# Patient Record
Sex: Male | Born: 1965 | Race: White | Hispanic: No | Marital: Single | Smoking: Never smoker
Health system: Southern US, Community
[De-identification: ages and names within clinical notes are randomized; demographics above are authoritative.]

---

## 2011-09-05 ENCOUNTER — Emergency Department (HOSPITAL_COMMUNITY): Payer: BC Managed Care – PPO

## 2011-09-05 ENCOUNTER — Encounter (HOSPITAL_COMMUNITY): Payer: Self-pay | Admitting: *Deleted

## 2011-09-05 ENCOUNTER — Observation Stay (HOSPITAL_COMMUNITY)
Admission: EM | Admit: 2011-09-05 | Discharge: 2011-09-06 | Disposition: A | Discharge status: Home / Self Care | Payer: BC Managed Care – PPO | Attending: Internal Medicine | Admitting: Internal Medicine

## 2011-09-05 DIAGNOSIS — R079 Chest pain, unspecified: Principal | ICD-10-CM | POA: Insufficient documentation

## 2011-09-05 DIAGNOSIS — R002 Palpitations: Secondary | ICD-10-CM | POA: Insufficient documentation

## 2011-09-05 LAB — CBC
HCT: 42.6 % (ref 39.0–52.0)
Hemoglobin: 14.7 g/dL (ref 13.0–17.0)
MCH: 28.5 pg (ref 26.0–34.0)
RBC: 5.15 MIL/uL (ref 4.22–5.81)

## 2011-09-05 LAB — BASIC METABOLIC PANEL
BUN: 9 mg/dL (ref 6–23)
CO2: 24 mEq/L (ref 19–32)
Calcium: 9.5 mg/dL (ref 8.4–10.5)
Glucose, Bld: 88 mg/dL (ref 70–99)
Potassium: 4.1 mEq/L (ref 3.5–5.1)
Sodium: 136 mEq/L (ref 135–145)

## 2011-09-05 MED ORDER — ACETAMINOPHEN 650 MG RE SUPP: 650.0000 mg | Freq: Four times a day (QID) | RECTAL | Status: DC | PRN

## 2011-09-05 MED ORDER — NITROGLYCERIN 0.4 MG SL SUBL: 0.4000 mg | SUBLINGUAL_TABLET | SUBLINGUAL | Status: DC | PRN

## 2011-09-05 MED ORDER — PANTOPRAZOLE SODIUM 40 MG PO TBEC
40.0000 mg | DELAYED_RELEASE_TABLET | Freq: Every day | ORAL | Status: DC
  Administered 2011-09-06: 40 mg via ORAL
  Filled 2011-09-05: qty 1

## 2011-09-05 MED ORDER — ACETAMINOPHEN 325 MG PO TABS: 650.0000 mg | ORAL_TABLET | Freq: Four times a day (QID) | ORAL | Status: DC | PRN

## 2011-09-05 MED ORDER — SODIUM CHLORIDE 0.9 % IJ SOLN
3.0000 mL | Freq: Two times a day (BID) | INTRAMUSCULAR | Status: DC
  Administered 2011-09-05: 3 mL via INTRAVENOUS

## 2011-09-05 MED ORDER — ASPIRIN EC 325 MG PO TBEC
325.0000 mg | DELAYED_RELEASE_TABLET | Freq: Every day | ORAL | Status: DC
  Administered 2011-09-06: 325 mg via ORAL
  Filled 2011-09-05: qty 1

## 2011-09-05 MED ORDER — GI COCKTAIL ~~LOC~~
30.0000 mL | Freq: Once | ORAL | Status: AC
  Administered 2011-09-05: 30 mL via ORAL
  Filled 2011-09-05: qty 30

## 2011-09-05 MED ORDER — NITROGLYCERIN 0.4 MG SL SUBL
0.4000 mg | SUBLINGUAL_TABLET | SUBLINGUAL | Status: DC | PRN
  Administered 2011-09-05: 0.4 mg via SUBLINGUAL
  Filled 2011-09-05: qty 25

## 2011-09-05 NOTE — ED Notes (Signed)
ED doctor notified that pt requested to speak with him about care.

## 2011-09-05 NOTE — ED Notes (Signed)
Pt reports driving today and having sudden onset central chest pressure beginning approx 12pm. Associated dizziness and weakness in his legs. Denies n/v, diaphoresis. Took 325mg  ASA when pain began. No Hx cardiac problems.

## 2011-09-05 NOTE — ED Provider Notes (Signed)
History     CSN: [id number]  Arrival date & time 09/05/11  1408   First MD Initiated Contact with Patient 09/05/11 1504      Chief Complaint  Patient presents with  . Chest Pain    (Consider location/radiation/quality/duration/timing/severity/associated sxs/prior treatment) The history is provided by the patient.   the patient reports driving today and developing severe onset chest pressure at approximately 11 AM.  He developed palpitations and became lightheaded.  He pulled his truck over reports the palpitations lasted for approximately 5 minutes.  He feel short of breath and generally weak all over.  He denies radiation of his pain.  He denies exertional chest pain but does report that over the past several weeks he may have had more shortness of breath when playing softball.  He does not have a physician so does not know if he has any medical problems.  He does not smoke cigarettes.  His no family history of early cardiac disease however there is a family history of heart disease in his 85s.  He took an aspirin when this discomfort occurred.  He presents the ER now and reports he still has very mild ongoing chest pressure but reports this chest pressure is much better.  He denies palpitations or lightheadedness now.  He denies history of PE or DVT.  Has not had similar symptoms before.  He called his wife who recommended that he come to the emergency department for evaluation.  He denies recent fevers or chills cough or congestion  History reviewed. No pertinent past medical history.  History reviewed. No pertinent past surgical history.  No family history on file.  History  Substance Use Topics  . Smoking status: Never Smoker   . Smokeless tobacco: Not on file  . Alcohol Use: No      Review of Systems  Cardiovascular: Positive for chest pain.  All other systems reviewed and are negative.    Allergies  Review of patient's allergies indicates no known allergies.  Home  Medications  No current outpatient prescriptions on file.  BP 137/69  Pulse 79  Temp(Src) 98.5 F (36.9 C) (Oral)  Resp 18  SpO2 99%  Physical Exam  Nursing note and vitals reviewed. Constitutional: He is oriented to person, place, and time. He appears well-developed and well-nourished.  HENT:  Head: Normocephalic and atraumatic.  Eyes: EOM are normal.  Neck: Normal range of motion.  Cardiovascular: Normal rate, regular rhythm, normal heart sounds and intact distal pulses.   Pulmonary/Chest: Effort normal and breath sounds normal. No respiratory distress.  Abdominal: Soft. He exhibits no distension. There is no tenderness.  Musculoskeletal: Normal range of motion.  Neurological: He is alert and oriented to person, place, and time.  Skin: Skin is warm and dry.  Psychiatric: He has a normal mood and affect. Judgment normal.    ED Course  Procedures (including critical care time)   Date: 09/05/2011  Rate: 78  Rhythm: normal sinus rhythm  QRS Axis: normal  Intervals: normal  ST/T Wave abnormalities: normal  Conduction Disutrbances: none  Narrative Interpretation:   Old EKG Reviewed: no prior ecg available    Labs Reviewed  CBC  BASIC METABOLIC PANEL  TROPONIN I   Dg Chest 2 View  09/05/2011  *RADIOLOGY REPORT*  Clinical Data: Chest pain, nonsmoker  CHEST - 2 VIEW  Comparison: None  Findings: Upper-normal size of cardiac silhouette. Mildly tortuous aorta. Pulmonary vascularity normal. Minimal peribronchial thickening without infiltrate or effusion. No pneumothorax or acute  osseous findings.  IMPRESSION: Minimal bronchitic changes without infiltrate.  Original Report Authenticated By: Lollie Marrow, M.D.   I personally reviewed the images   1. Chest pain   2. Palpitations       MDM  The patient's story is concerning enough that he likely would benefit from observation overnight with serial enzymes and cardiac monitoring during his palpitations and lightheadedness.   He did not have true syncope.  His EKG in the emergency department is normal sinus rhythm without ectopy or concerning ST or T wave changes        Lyanne Co, MD 09/05/11 [phone]

## 2011-09-05 NOTE — H&P (Signed)
[facility]                                                                                     Patient Demographics  Jacob Cochran, is a 46 y.o. male  CSN: [id number]  MRN: [id number]  DOB - [birth year]  Admit Date - 09/05/2011  Outpatient Primary MD for the patient is No primary provider on file.   With History of -  History reviewed. No pertinent past medical history.    History reviewed. No pertinent past surgical history.  in for   Chief Complaint  Patient presents with  . Chest Pain     HPI  Jacob Cochran  is a 46 y.o. 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, denies any previous episodes, reports its chest pressure much improved now, patient did check a 325 mg of aspirin by himself, as well patient received one sublingual nitroglycerin in ED, patient reports this episode of chest pain was accompanied by generalized weakness lightheadedness and palpitation, denies any shortness of breath any diaphoresis any nausea, patient does not have any significant past medical history of cardiac disease, patient he is actively and playing softball every weekend without any complaints, patient does not have primary care physician said he is unaware if he has hyperlipidemia of hypertension., Patient is eating ED did not show any significant ST depression or elevation any acute infection his first set of cardiac enzymes were negative, hospitalist service we are requested to admit the patient for rule out.   Review of Systems    In addition to the HPI above,  No Fever-chills, No Headache, No changes with Vision or hearing, No problems swallowing food or Liquids, Complaints of Chest pain, no complaints of Cough or Shortness of Breath, No Abdominal pain, No Nausea or Vommitting, Bowel movements are regular, No Blood in stool or  Urine, No dysuria, No new skin rashes or bruises, No new joints pains-aches,  Generalized weakness, no tingling, numbness in any extremity, No recent weight gain or loss, No polyuria, polydypsia or polyphagia, No significant Mental Stressors.  A full 10 point Review of Systems was done, except as stated above, all other Review of Systems were negative.   Social History History  Substance Use Topics  . Smoking status: Never Smoker   . Smokeless tobacco: Not on file  . Alcohol Use: No     Family History Significant for cardiac disease in the family  Prior to Admission medications   Not on File    No Known Allergies  Physical Exam  Vitals  Blood pressure 116/56, pulse 69, temperature 98.1 F (36.7 C), temperature source Oral, resp. rate 11, SpO2 100.00%.   1. General well-nourished male lying in bed in NAD,    2. Normal affect and insight, Not Suicidal or Homicidal, Awake Alert, Oriented *3.  3. No F.N deficits, ALL C.Nerves Intact, Strength 5/5 all 4 extremities, Sensation intact all 4 extremities, Plantars down going.  4. Ears and Eyes appear Normal, Conjunctivae clear, PERRLA. Moist Oral Mucosa.  5. Supple Neck, No  JVD, No cervical lymphadenopathy appriciated, No Carotid Bruits.  6. Symmetrical Chest wall movement, Good air movement bilaterally, CTAB.  7. RRR, No Gallops, Rubs or Murmurs, No Parasternal Heave.  8. Positive Bowel Sounds, Abdomen Soft, Non tender, No organomegaly appriciated,       No rebound -guarding or rigidity.  9.  No Cyanosis, Normal Skin Turgor, No Skin Rash or Bruise.  10. Good muscle tone,  joints appear normal , no effusions, Normal ROM.  11. No Palpable Lymph Nodes in Neck or Axillae    Data Review  CBC  Lab 09/05/11 1515  WBC 6.3  HGB 14.7  HCT 42.6  PLT 220  MCV 82.7  MCH 28.5  MCHC 34.5  RDW 13.2  LYMPHSABS --  MONOABS --  EOSABS --  BASOSABS --  BANDABS --     Chemistries   Lab 09/05/11 1515  NA 136    K 4.1  CL 100  CO2 24  GLUCOSE 88  BUN 9  CREATININE 0.71  CALCIUM 9.5  MG --  AST --  ALT --  ALKPHOS --  BILITOT --   Cardiac Enzymes  Lab 09/05/11 1515  CKMB --  TROPONINI <0.30  MYOGLOBIN --     ---------------------------------------------------------------------------------------------------------------   ----------------------------------------------------------------------------------------------------------------  Imaging results:   Dg Chest 2 View  09/05/2011  *RADIOLOGY REPORT*  Clinical Data: Chest pain, nonsmoker  CHEST - 2 VIEW  Comparison: None  Findings: Upper-normal size of cardiac silhouette. Mildly tortuous aorta. Pulmonary vascularity normal. Minimal peribronchial thickening without infiltrate or effusion. No pneumothorax or acute osseous findings.  IMPRESSION: Minimal bronchitic changes without infiltrate.  Original Report Authenticated By: Lollie Marrow, M.D.   EKG showing sinus rhythm, not any significant ST changes or T wave changes   Assessment & Plan  Principal Problem:  *Chest pain    1. chest pain. We'll admit patient to observation on telemetry unit will monitor her cardiac rhythm, will check 2-D echo in a.m. and we'll cycle 3 sets total of cardiac enzymes to rule him out for acute or syndrome patient urgency of the 25 of aspirin ,will check lipid panel in a.m., unlikely this chest pain is cardiac of addition has patient he is pretty active guy who exercises every weekend without any symptoms. [place] Planas will start him on Protonix for possible GERD, especially given his history of hiatal hernia   DVT Prophylaxis  SCDs , patient is ambulatory.  AM Labs Ordered, also please review Full Orders  Admission, patients condition and plan of care including tests being ordered have been discussed with the patient and wife who indicate understanding and agree with the plan and Code Status.  Code Status full  Condition stable.  Randol Kern, Laterica Matarazzo M.D  on 09/05/2011 at 6:37 PM [facility] Office  [phone]

## 2011-09-05 NOTE — ED Notes (Signed)
Patient transported to X-ray 

## 2011-09-06 DIAGNOSIS — R079 Chest pain, unspecified: Secondary | ICD-10-CM

## 2011-09-06 LAB — LIPID PANEL
Cholesterol: 296 mg/dL — ABNORMAL HIGH (ref 0–200)
LDL Cholesterol: UNDETERMINED mg/dL (ref 0–99)
Total CHOL/HDL Ratio: 8 RATIO
Triglycerides: 666 mg/dL — ABNORMAL HIGH (ref <150)
VLDL: UNDETERMINED mg/dL (ref 0–40)

## 2011-09-06 MED ORDER — ASPIRIN 81 MG PO TBEC: 81.0000 mg | DELAYED_RELEASE_TABLET | Freq: Every day | ORAL | Status: AC

## 2011-09-06 MED ORDER — NIACIN 50 MG PO TABS
50.0000 mg | ORAL_TABLET | Freq: Two times a day (BID) | ORAL | Status: DC
  Filled 2011-09-06 (×2): qty 1

## 2011-09-06 MED ORDER — NIACIN 50 MG PO TABS: 50.0000 mg | ORAL_TABLET | Freq: Two times a day (BID) | ORAL | Status: DC

## 2011-09-06 MED ORDER — FENOFIBRATE 160 MG PO TABS: 160.0000 mg | ORAL_TABLET | Freq: Every day | ORAL | Status: DC

## 2011-09-06 MED ORDER — NIACIN 50 MG PO TABS: 50.0000 mg | ORAL_TABLET | Freq: Two times a day (BID) | ORAL | Status: AC

## 2011-09-06 MED ORDER — PANTOPRAZOLE SODIUM 40 MG PO TBEC: 40.0000 mg | DELAYED_RELEASE_TABLET | Freq: Every day | ORAL | Status: AC

## 2011-09-06 MED ORDER — FENOFIBRATE 160 MG PO TABS
160.0000 mg | ORAL_TABLET | Freq: Every day | ORAL | Status: DC
  Filled 2011-09-06: qty 1

## 2011-09-06 MED ORDER — FENOFIBRATE 160 MG PO TABS: 160.0000 mg | ORAL_TABLET | Freq: Every day | ORAL | Status: AC

## 2011-09-06 NOTE — Discharge Summary (Addendum)
[facility]                                                                                   Jacob Cochran, is a 46 y.o. male  DOB 1966-01-30  MRN [id number].  Admission date:  09/05/2011  Discharge Date:  09/06/2011  Primary MD  No primary provider on file.  Admitting Physician  Dorothea Ogle, MD  Admission Diagnosis  Palpitations [785.1] Chest pain [786.50] CHEST PAIN  Discharge Diagnosis     Principal Problem:  *Chest pain     Hospital Course See H&P, Labs, Consult and Test reports for all details in brief, patient was admitted for  atypical substernal chest pressure for 5 minutes, symptoms resolved before patient presented to the ER, he did have mild weakness in his legs and felt that his heart beat was racing at that point. He did not describe any radiation of his chest discomfort, he does not smoke, no personal or family history of CAD, no personal history of dyslipidemia hypertension, he is quite active plays softball every weekend without any problems, chest x-ray EKG 3 sets of troponin negative, case discussed at length with Dr. Jacinto Halim. cardiologist recommends no further testing in inpatient setting, he will see the patient in 3-4 days in office and do a treadmill stress test. Patient is completely symptom-free will be discharged home on aspirin. He has a pending lipid panel which will be followed by Dr. Jacinto Halim in his office.   Addendum - pts cholestrol panel is back  Lab Results  Component Value Date   CHOL 296* 09/06/2011   HDL 37* 09/06/2011   LDLCALC UNABLE TO CALCULATE IF TRIGLYCERIDE OVER 400 mg/dL 4/0/9811   TRIG 914* 10/09/2954   CHOLHDL 8.0 09/06/2011    Will be placed on Tricor and Niacin, will need repeat Lipid panel in 5-6 weeks .   Consults Dr Morley Kos  Significant Tests:  See full reports for all details     Dg Chest 2 View  09/05/2011  *RADIOLOGY REPORT*  Clinical Data: Chest pain, nonsmoker  CHEST - 2 VIEW  Comparison: None  Findings:  Upper-normal size of cardiac silhouette. Mildly tortuous aorta. Pulmonary vascularity normal. Minimal peribronchial thickening without infiltrate or effusion. No pneumothorax or acute osseous findings.  IMPRESSION: Minimal bronchitic changes without infiltrate.  Original Report Authenticated By: Lollie Marrow, M.D.     Today   Subjective:   Jacob Cochran today has no headache,no chest abdominal pain,no new weakness tingling or numbness, feels much better wants to go home today.    Objective:   Blood pressure 109/73, pulse 54, temperature 97.5 F (36.4 C), temperature source Oral, resp. rate 16, height 5\' 11"  (1.803 m), weight 97.2 kg (214 lb 4.6 oz), SpO2 94.00%. No intake or output data in the 24 hours ending 09/06/11 0803  Exam Awake Alert, Oriented *3, No new F.N deficits, Normal affect [name],PERRAL Supple Neck,No JVD, No cervical lymphadenopathy appriciated.  Symmetrical Chest wall movement, Good air movement bilaterally, CTAB RRR,No Gallops,Rubs or new Murmurs, No Parasternal Heave +ve B.Sounds, Abd Soft, Non tender, No organomegaly appriciated, No rebound -guarding or rigidity. No Cyanosis, Clubbing or edema, No new Rash  or bruise  Data Review   Cardiac Panel (last 3 results)  Basename 09/06/11 0650 09/06/11 0001 09/05/11 1515  CKTOTAL -- -- --  CKMB -- -- --  TROPONINI <0.30 <0.30 <0.30  RELINDX -- -- --    CBC w Diff:  Lab Results  Component Value Date   WBC 6.3 09/05/2011   HGB 14.7 09/05/2011   HCT 42.6 09/05/2011   PLT 220 09/05/2011    CMP:  Lab Results  Component Value Date   NA 136 09/05/2011   K 4.1 09/05/2011   CL 100 09/05/2011   CO2 24 09/05/2011   BUN 9 09/05/2011   CREATININE 0.71 09/05/2011  .   Discharge Instructions     Follow with Primary MD  in 7 days , follow your Cholesterol panel results  Get CBC, CMP, checked 7 days by Primary MD and again as instructed by your Primary MD. Get a 2 view Chest X ray done next visit if you had Pneumonia of Lung  problems at the Hospital.  Get Medicines reviewed and adjusted.  Please request your Prim.MD to go over all Hospital Tests and Procedure/Radiological results at the follow up, please get all Hospital records sent to your Prim MD by signing hospital release before you go home.  Activity: As tolerated with Full fall precautions use walker/cane & assistance as needed  Diet: Heart Healthy   Disposition Home  If you experience worsening of your admission symptoms, develop shortness of breath, life threatening emergency, suicidal or homicidal thoughts you must seek medical attention immediately by calling 911 or calling your MD immediately  if symptoms less severe.  You Must read complete instructions/literature along with all the possible adverse reactions/side effects for all the Medicines you take and that have been prescribed to you. Take any new Medicines after you have completely understood and accpet all the possible adverse reactions/side effects.    Do not drive when taking Pain medications.   Do not take more than prescribed Pain, Sleep and Anxiety Medications  Special Instructions: If you have smoked or chewed Tobacco  in the last 2 yrs please stop smoking, stop any regular Alcohol  and or any Recreational drug use.  Wear Seat belts while driving.  Do not drive till you are seen by Dr Jacinto Halim (heart doctor) and advised to drive.  Jacob Cochran was admitted to the Hospital on 09/05/2011 and Discharged on Discharge Date 09/06/2011 and should be excused from work/school   for 7 days starting 09/05/2011 , may return to work/school without any restrictions.  Call Susa Raring MD, [facility] [phone] with questions.  Leroy Sea M.D on 09/06/2011,at 8:02 AM  [facility] Office  [phone]    Follow-up Information    Follow up with Pamella Pert, MD. Schedule an appointment as soon as possible for a visit in 3 days.   Contact information:   [address]  [place] [address] [phone]          Discharge Medications    Jacob Cochran, Jacob Cochran  Home Medication Instructions [id number]   Printed on:09/06/11 1019  Medication Information                    aspirin 81 MG EC tablet Take 1 tablet (81 mg total) by mouth daily. Swallow whole.           pantoprazole (PROTONIX) 40 MG tablet Take 1 tablet (40 mg total) by mouth daily.  niacin 50 MG tablet Take 1 tablet (50 mg total) by mouth 2 (two) times daily with a meal.           fenofibrate 160 MG tablet Take 1 tablet (160 mg total) by mouth daily.             Total Time in preparing paper work, data evaluation and todays exam - 35 minutes  Leroy Sea M.D on 09/06/2011 at 8:03 AM  [facility] Office  [phone]

## 2011-09-06 NOTE — Discharge Instructions (Signed)
Follow with Primary MD  in 7 days , follow your Cholesterol panel results  Get CBC, CMP, checked 7 days by Primary MD and again as instructed by your Primary MD. Get a 2 view Chest X ray done next visit if you had Pneumonia of Lung problems at the Hospital.  Get Medicines reviewed and adjusted.  Please request your Prim.MD to go over all Hospital Tests and Procedure/Radiological results at the follow up, please get all Hospital records sent to your Prim MD by signing hospital release before you go home.  Activity: As tolerated with Full fall precautions use walker/cane & assistance as needed  Diet: Heart Healthy   Disposition Home  If you experience worsening of your admission symptoms, develop shortness of breath, life threatening emergency, suicidal or homicidal thoughts you must seek medical attention immediately by calling 911 or calling your MD immediately  if symptoms less severe.  You Must read complete instructions/literature along with all the possible adverse reactions/side effects for all the Medicines you take and that have been prescribed to you. Take any new Medicines after you have completely understood and accpet all the possible adverse reactions/side effects.    Do not drive when taking Pain medications.   Do not take more than prescribed Pain, Sleep and Anxiety Medications  Special Instructions: If you have smoked or chewed Tobacco  in the last 2 yrs please stop smoking, stop any regular Alcohol  and or any Recreational drug use.  Wear Seat belts while driving.  Do not drive till you are seen by Dr Jacinto Halim (heart doctor) and advised to drive.  Jacob Cochran was admitted to the Hospital on 09/05/2011 and Discharged on Discharge Date 09/06/2011 and should be excused from work/school   for 7 days starting 09/05/2011 , may return to work/school without any restrictions.  Call Susa Raring MD, [facility] [phone] with questions.  Leroy Sea M.D on  09/06/2011,at 8:02 AM  [facility] Office  [phone]

## 2014-01-01 IMAGING — CR DG CHEST 2V
2 series · 2 of 2 positions shown · non-contrast
Comparison: None

CLINICAL DATA: Chest pain, nonsmoker

CHEST - 2 VIEW

[w chest pa]
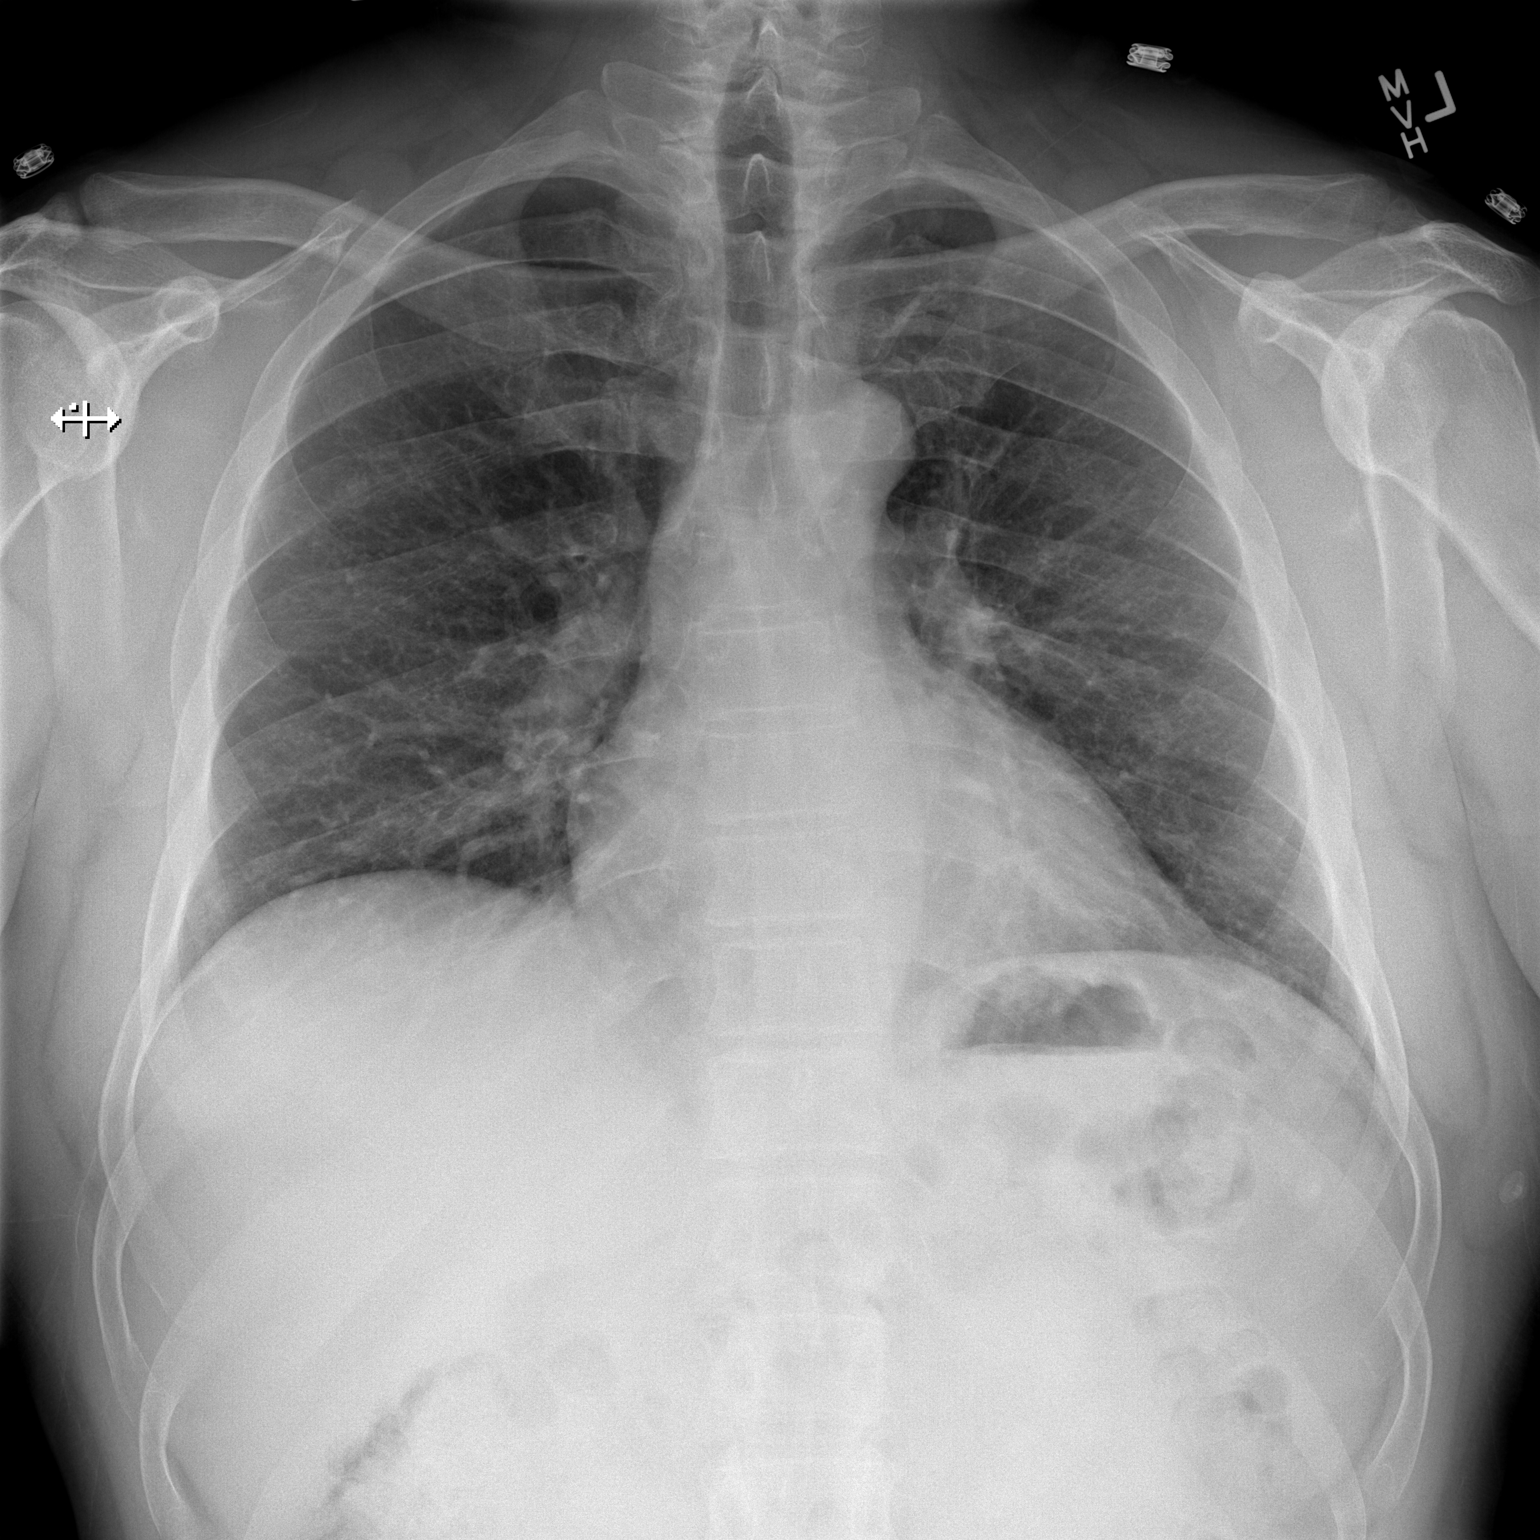

[w chest lat]
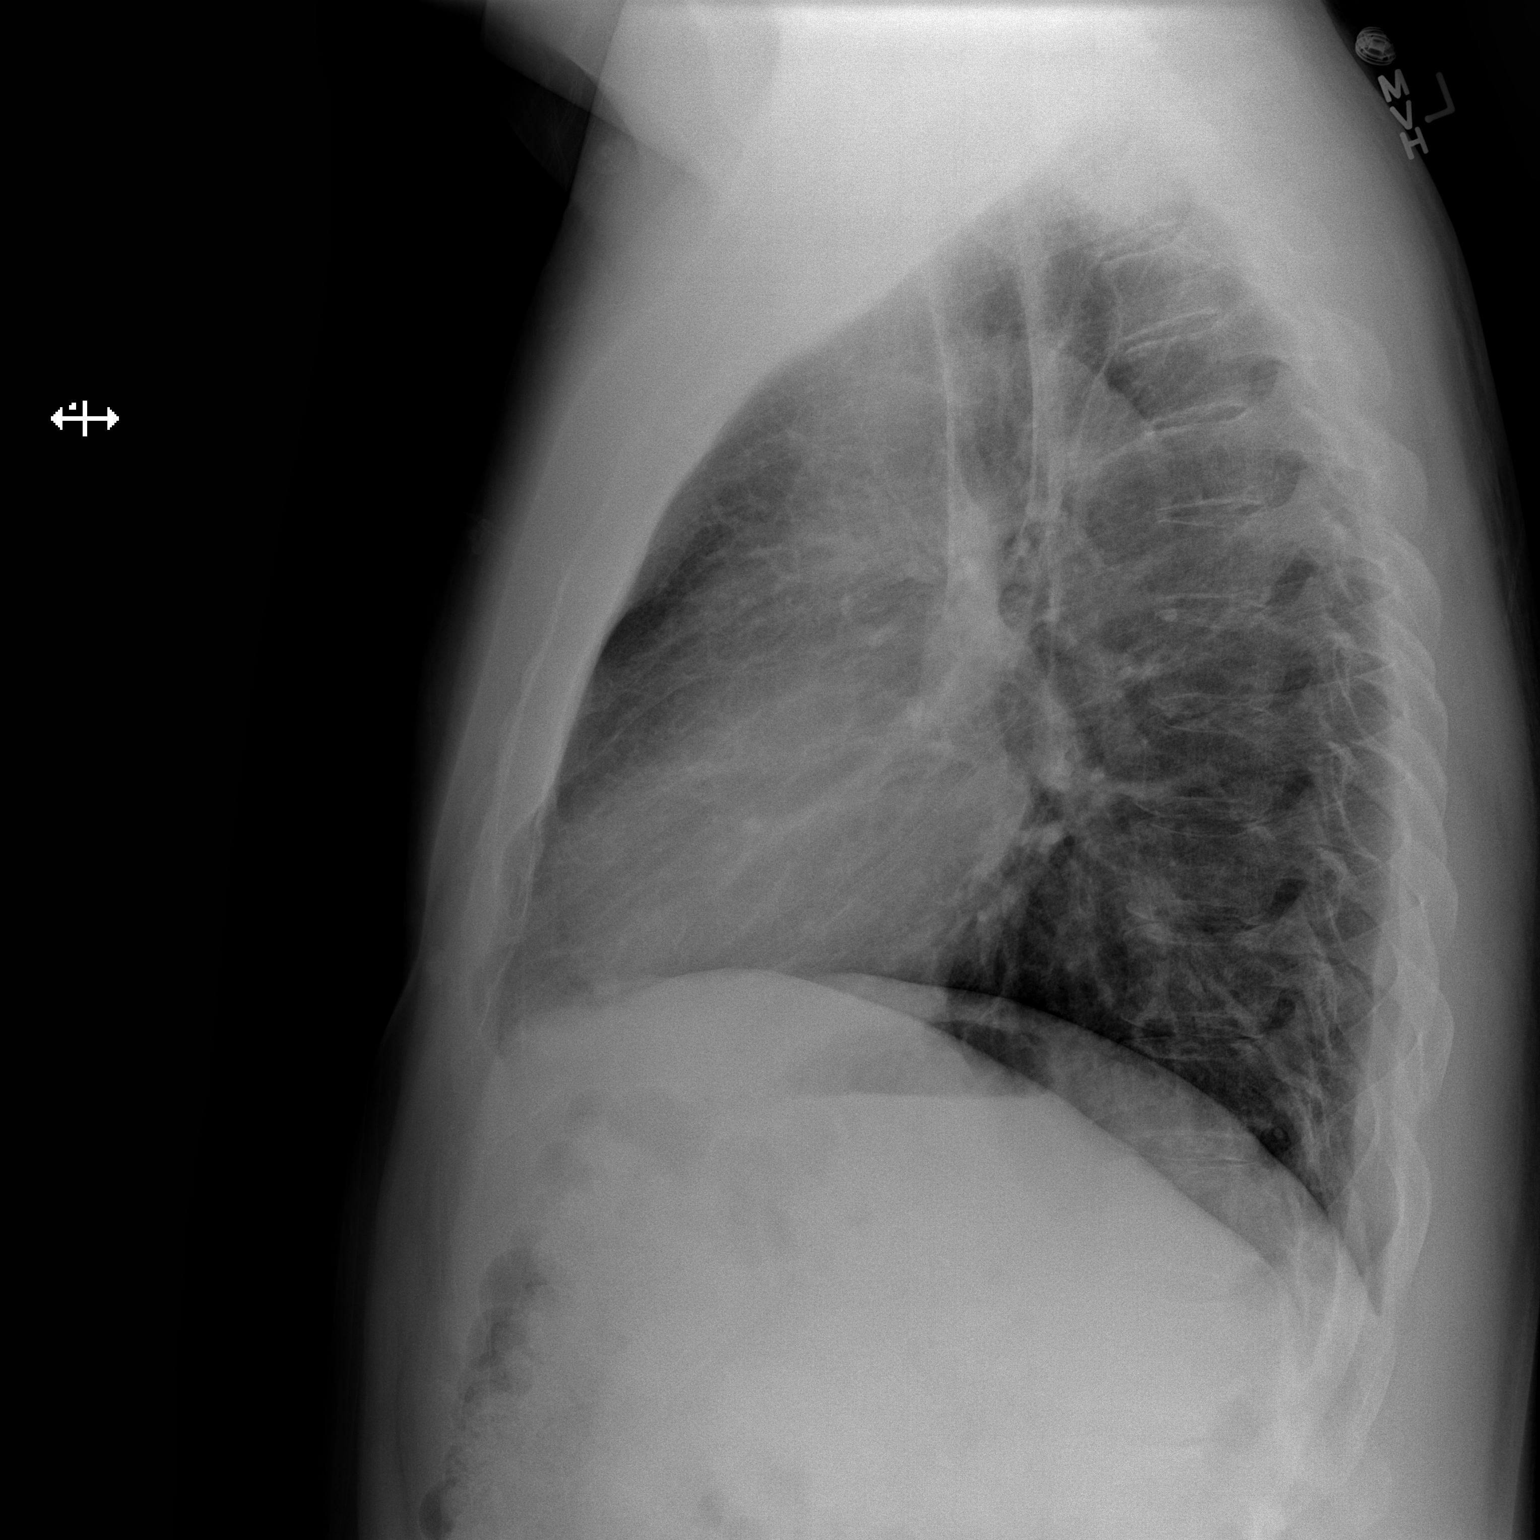

[2 of 2 positions shown; findings below may reference images not displayed]

FINDINGS: Upper-normal size of cardiac silhouette.
Mildly tortuous aorta.
Pulmonary vascularity normal.
Minimal peribronchial thickening without infiltrate or effusion.
No pneumothorax or acute osseous findings.
IMPRESSION: Minimal bronchitic changes without infiltrate.

## 2016-08-01 DEATH — deceased

## 2020-08-27 ENCOUNTER — Emergency Department (HOSPITAL_COMMUNITY)
Admission: EM | Admit: 2020-08-27 | Discharge: 2020-08-27 | Disposition: A | Discharge status: Home / Self Care | Payer: BC Managed Care – PPO | Attending: Emergency Medicine | Admitting: Emergency Medicine

## 2020-08-27 ENCOUNTER — Other Ambulatory Visit: Payer: Self-pay

## 2020-08-27 DIAGNOSIS — B882 Other arthropod infestations: Secondary | ICD-10-CM | POA: Insufficient documentation

## 2020-08-27 DIAGNOSIS — R5381 Other malaise: Secondary | ICD-10-CM | POA: Insufficient documentation

## 2020-08-27 DIAGNOSIS — Z20822 Contact with and (suspected) exposure to covid-19: Secondary | ICD-10-CM | POA: Diagnosis not present

## 2020-08-27 DIAGNOSIS — R5383 Other fatigue: Secondary | ICD-10-CM | POA: Insufficient documentation

## 2020-08-27 DIAGNOSIS — R531 Weakness: Secondary | ICD-10-CM | POA: Diagnosis not present

## 2020-08-27 LAB — SARS CORONAVIRUS 2 (TAT 6-24 HRS): SARS Coronavirus 2: NEGATIVE

## 2020-08-27 MED ORDER — DOXYCYCLINE HYCLATE 100 MG PO CAPS: 100.0000 mg | ORAL_CAPSULE | Freq: Two times a day (BID) | ORAL | 0 refills | Status: AC

## 2020-08-27 NOTE — ED Provider Notes (Signed)
[facility]-EMERGENCY DEPT [facility] Emergency Department Provider Note MRN:  [id number]  Arrival date & time: 08/27/20     Chief Complaint   Insect Bite   History of Present Illness   Jacob Cochran is a 55 y.o. year-old male with no pertinent past medical history presenting to the ED with chief complaint of insect bite.  Patient found some ticks underneath his right great toe about 1 week ago.  About 1 or 2 days later, began having generalized weakness, malaise, fatigue, feeling generally unwell.  Denies cough, does endorse chills and possibly subjective fever.  Denies chest pain or shortness of breath, no abdominal pain.  The right great toe did swell and become red but this has resolved.  Review of Systems  A complete 10 system review of systems was obtained and all systems are negative except as noted in the HPI and PMH.   Patient's Health History   No past medical history on file.  No past surgical history on file.  No family history on file.  Social History   Socioeconomic History  . Marital status: Single    Spouse name: Not on file  . Number of children: Not on file  . Years of education: Not on file  . Highest education level: Not on file  Occupational History  . Not on file  Tobacco Use  . Smoking status: Never Smoker  . Smokeless tobacco: Not on file  Substance and Sexual Activity  . Alcohol use: No  . Drug use: No  . Sexual activity: Not on file  Other Topics Concern  . Not on file  Social History Narrative  . Not on file   Social Determinants of Health   Financial Resource Strain: Not on file  Food Insecurity: Not on file  Transportation Needs: Not on file  Physical Activity: Not on file  Stress: Not on file  Social Connections: Not on file  Intimate Partner Violence: Not on file     Physical Exam   Vitals:   08/27/20 0509  BP: 122/84  Pulse: 81  Resp: 17  Temp: 99.7 F (37.6 C)  SpO2: 95%    CONSTITUTIONAL: Well-appearing, NAD NEURO:   Alert and oriented x 3, no focal deficits EYES:  eyes equal and reactive ENT/NECK:  no LAD, no JVD CARDIO: Regular rate, well-perfused, normal S1 and S2 PULM:  CTAB no wheezing or rhonchi GI/GU:  normal bowel sounds, non-distended, non-tender MSK/SPINE:  No gross deformities, no edema SKIN:  no rash, atraumatic PSYCH:  Appropriate speech and behavior  *Additional and/or pertinent findings included in MDM below  Diagnostic and Interventional Summary    EKG Interpretation  Date/Time:    Ventricular Rate:    PR Interval:    QRS Duration:   QT Interval:    QTC Calculation:   R Axis:     Text Interpretation:        Labs Reviewed  SARS CORONAVIRUS 2 (TAT 6-24 HRS)    No orders to display    Medications - No data to display   Procedures  /  Critical Care Procedures  ED Course and Medical Decision Making  I have reviewed the triage vital signs, the nursing notes, and pertinent available records from the EMR.  Listed above are laboratory and imaging tests that I personally ordered, reviewed, and interpreted and then considered in my medical decision making (see below for details).  Given the history and chronology of events, tickborne illness is a consideration and worth treating for.  Will provide prescription for doxycycline.  Overall patient is well-appearing, normal vital signs, currently without indication for imaging or laboratory evaluation.  Will swab for COVID, appropriate for discharge.       Elmer Sow. Pilar Plate, MD [facility] Health Emergency Medicine [facility] [email or web address]  Final Clinical Impressions(s) / ED Diagnoses     ICD-10-CM   1. Malaise and fatigue  R53.81    R53.83   2. Tick-borne disease  B88.2     ED Discharge Orders         Ordered    doxycycline (VIBRAMYCIN) 100 MG capsule  2 times daily        08/27/20 0542           Discharge Instructions Discussed with and Provided to Patient:     Discharge Instructions      You were evaluated in the Emergency Department and after careful evaluation, we did not find any emergent condition requiring admission or further testing in the hospital.  Your exam/testing today was overall reassuring.  Symptoms may be due to a tickborne illness.  Please take the doxycycline medication as directed.  We swabbed you for COVID and for flu, please follow-up on your results on MyChart.  Please return to the Emergency Department if you experience any worsening of your condition.  Thank you for allowing [place] to be a part of your care.       Sabas Sous, MD 08/27/20 [phone]

## 2020-08-27 NOTE — ED Triage Notes (Addendum)
Pt states he was bit by a tick a week ago. Pt states he has felt weak, has joint pain and has had chills for the last few day.

## 2020-08-27 NOTE — Discharge Instructions (Addendum)
You were evaluated in the Emergency Department and after careful evaluation, we did not find any emergent condition requiring admission or further testing in the hospital.  Your exam/testing today was overall reassuring.  Symptoms may be due to a tickborne illness.  Please take the doxycycline medication as directed.  We swabbed you for COVID and for flu, please follow-up on your results on MyChart.  Please return to the Emergency Department if you experience any worsening of your condition.  Thank you for allowing [place] to be a part of your care.

## 2020-08-28 ENCOUNTER — Telehealth: Payer: Self-pay

## 2020-08-28 NOTE — Telephone Encounter (Signed)
Wife called to get flu results. Upon chart review, did not see flu results. Transferred wife to [facility] ED.

## 2022-12-18 ENCOUNTER — Ambulatory Visit: Payer: BC Managed Care – PPO | Admitting: Podiatry
